# Patient Record
Sex: Female | Born: 1975 | Marital: Married
Health system: Southern US, Community
[De-identification: ages and names within clinical notes are randomized; demographics above are authoritative.]

---

## 2011-04-03 ENCOUNTER — Encounter: Payer: Self-pay | Admitting: Obstetrics & Gynecology

## 2011-08-20 ENCOUNTER — Emergency Department: Payer: Self-pay | Admitting: Emergency Medicine

## 2011-08-20 LAB — CBC
HCT: 41.4 % (ref 35.0–47.0)
HGB: 13.9 g/dL (ref 12.0–16.0)
MCH: 31.3 pg (ref 26.0–34.0)
MCHC: 33.6 g/dL (ref 32.0–36.0)
Platelet: 200 10*3/uL (ref 150–440)
RDW: 14.2 % (ref 11.5–14.5)

## 2011-08-20 LAB — HCG, QUANTITATIVE, PREGNANCY: Beta Hcg, Quant.: 3665 m[IU]/mL — ABNORMAL HIGH

## 2011-08-21 ENCOUNTER — Ambulatory Visit: Payer: Self-pay | Admitting: Obstetrics and Gynecology

## 2011-08-21 LAB — CBC
HCT: 39.6 % (ref 35.0–47.0)
MCH: 31.1 pg (ref 26.0–34.0)
MCHC: 33.7 g/dL (ref 32.0–36.0)
Platelet: 212 10*3/uL (ref 150–440)
RDW: 14 % (ref 11.5–14.5)
WBC: 7.3 10*3/uL (ref 3.6–11.0)

## 2011-08-21 LAB — URINALYSIS, COMPLETE
Glucose,UR: NEGATIVE mg/dL (ref 0–75)
Ketone: NEGATIVE
Nitrite: NEGATIVE
Ph: 5 (ref 4.5–8.0)
Protein: NEGATIVE
Specific Gravity: 1.023 (ref 1.003–1.030)
Squamous Epithelial: 2

## 2011-08-22 ENCOUNTER — Ambulatory Visit: Payer: Self-pay | Admitting: Obstetrics and Gynecology

## 2013-10-31 IMAGING — US US OB < 14 WEEKS
1 series · 17 of 28 positions shown · non-contrast
Comparison: none

REASON FOR EXAM: vaginal bleeding
COMMENTS:

[Series 1: us ob < 14 weeks · 38 acquisitions, 17 frames shown]
[im 1/38]
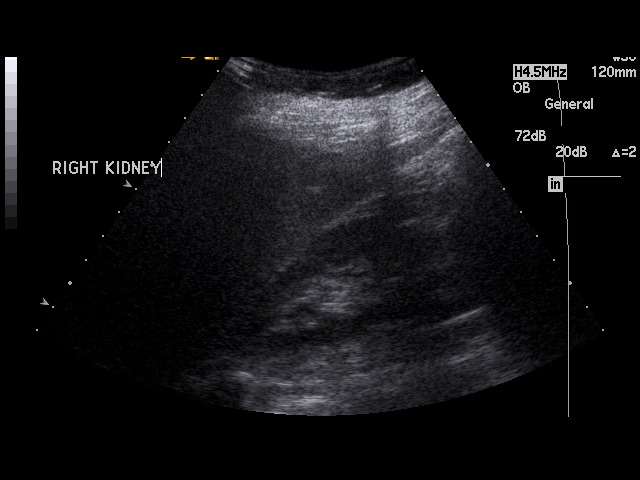
[im 3/38]
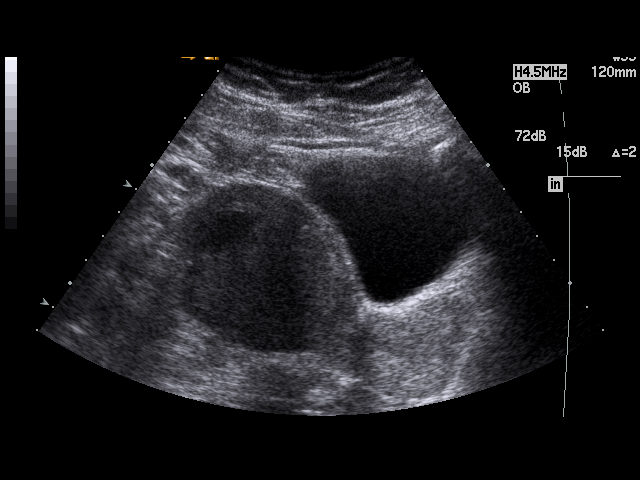
[im 6/38]
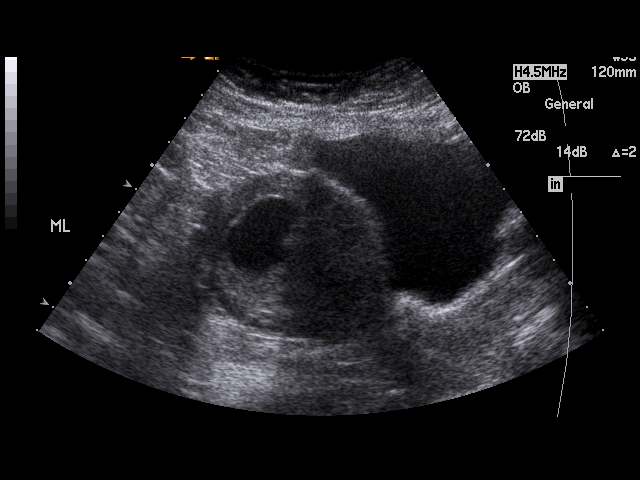
[im 7/38]
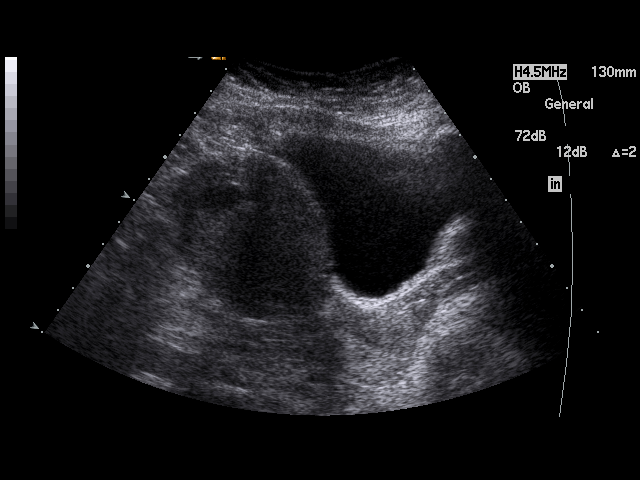
[im 10/38]
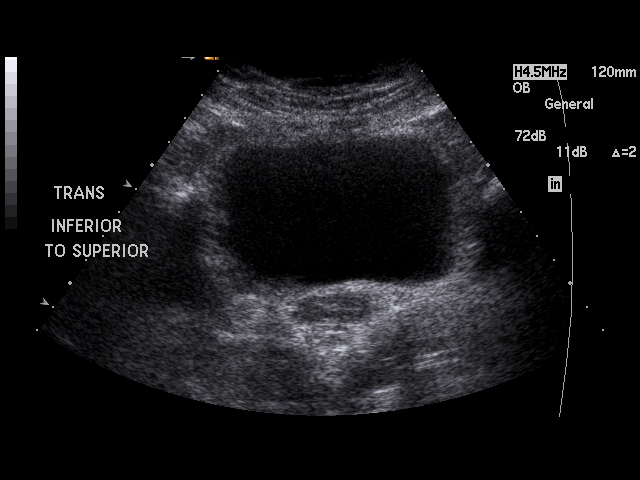
[im 13/38]
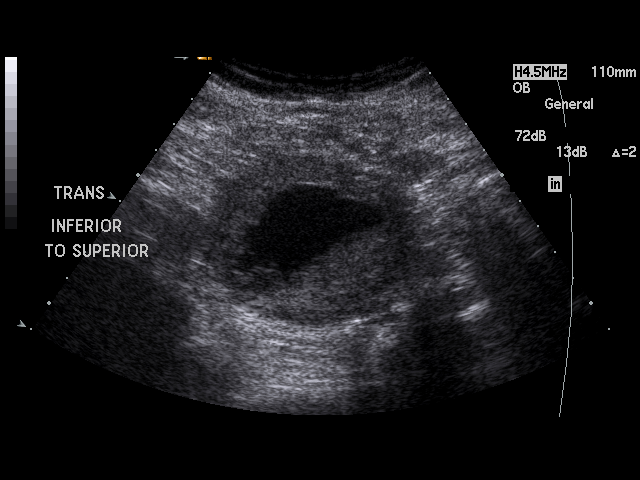
[im 14/38]
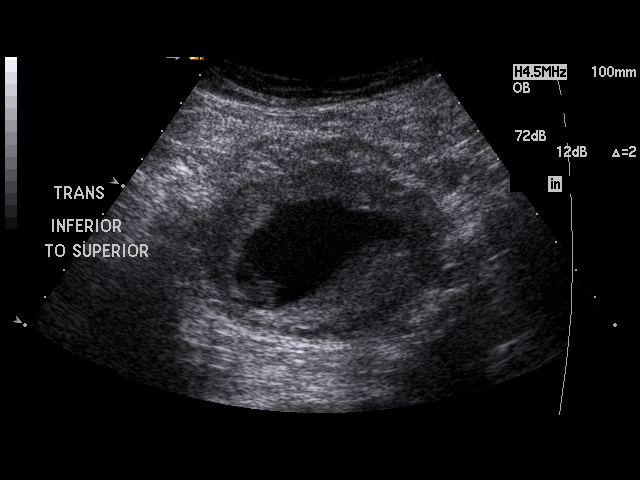
[im 17/38]
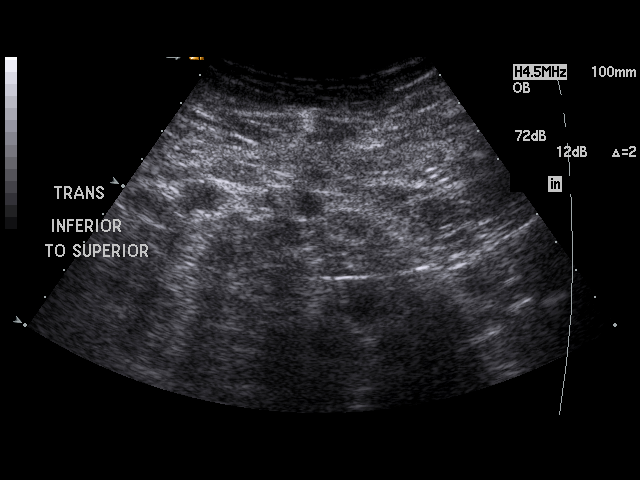
[im 20/38]
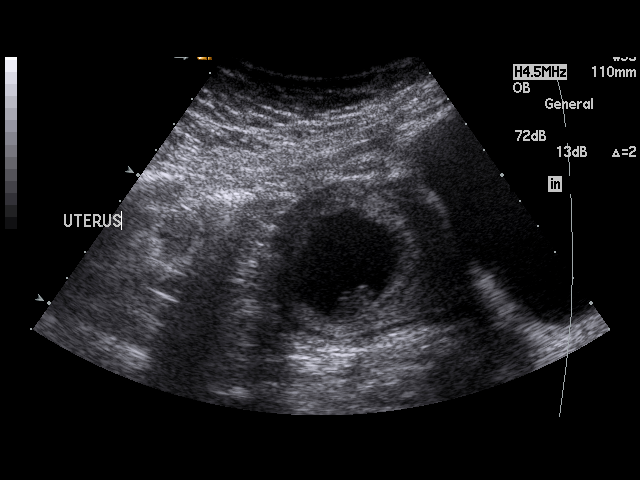
[im 21/38]
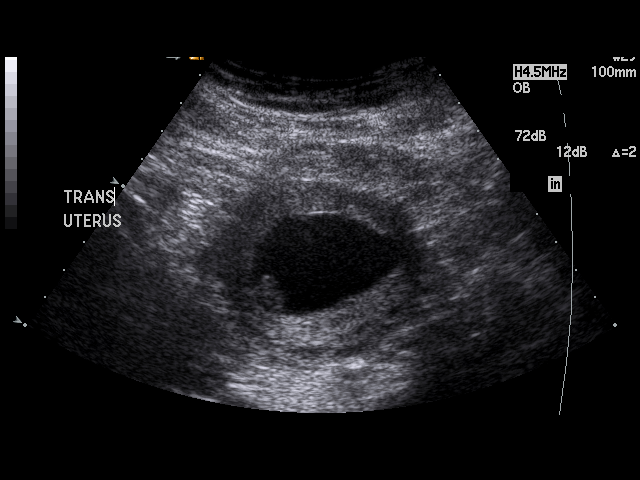
[im 24/38]
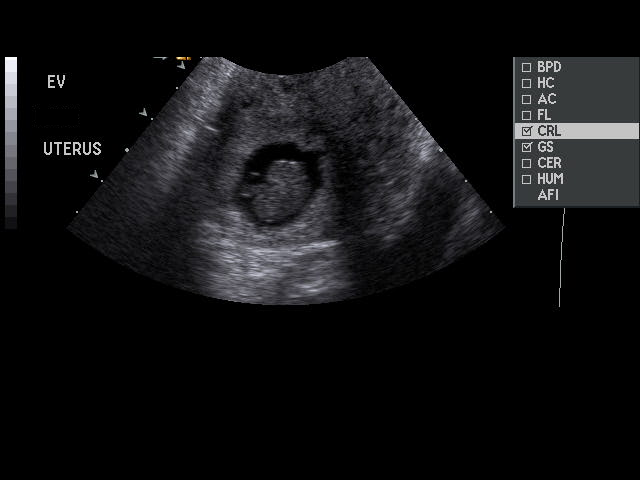
[im 25/38]
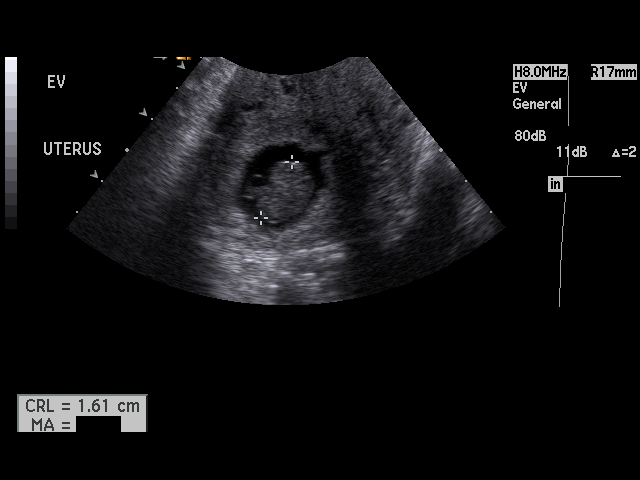
[im 28/38]
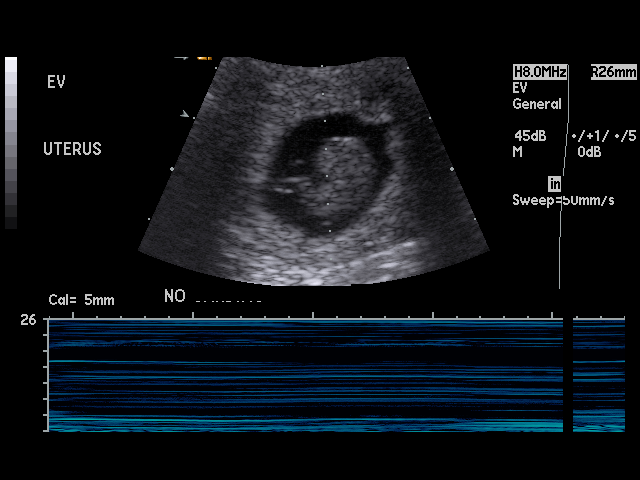
[im 31/38]
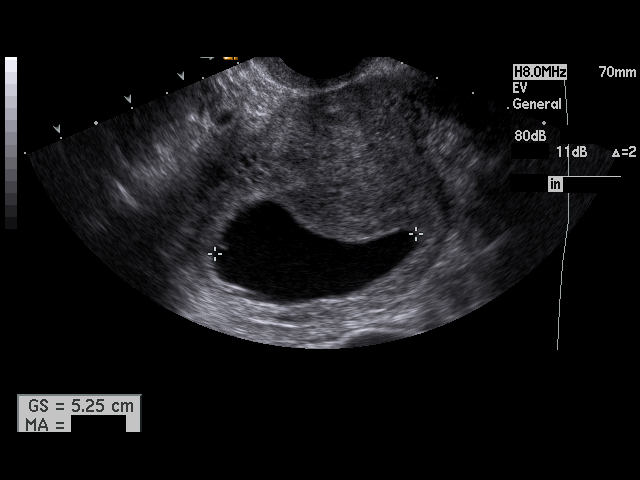
[im 32/38]
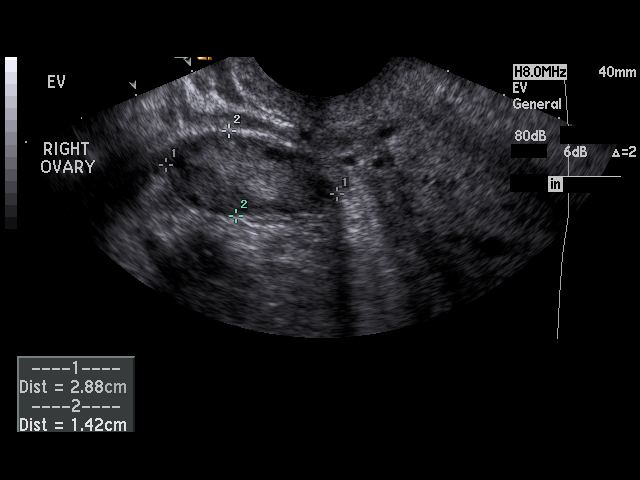
[im 35/38]
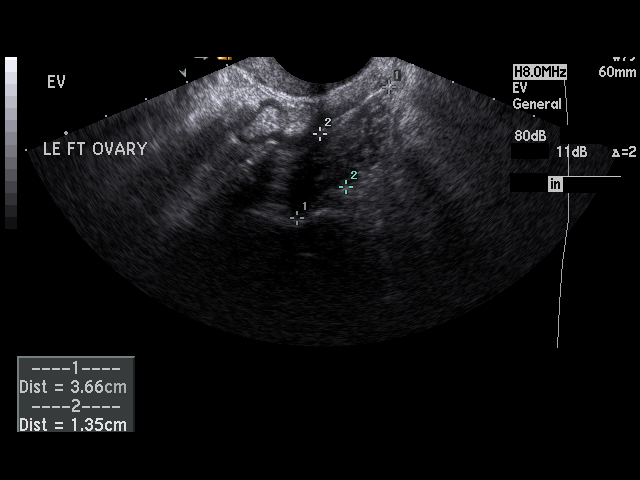
[im 38/38]
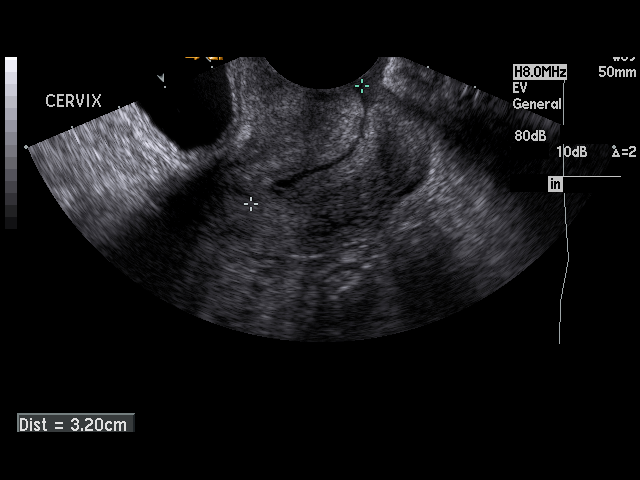

[17 of 28 positions shown; findings below may reference images not displayed]

PROCEDURE:     US  - US OB LESS THAN 14 WEEKS  - August 20, 2011 [DATE]

RESULT:     There is a gravid uterus present. No cardiac activity is
demonstrated. The crown-rump length of the fetus is 1.58 cm corresponding to
an 8 week 0 day gestation. The gestational sac size is 5.29 cm which
corresponds to an 11 week one day gestation.

The maternal ovaries are normal in size and echotexture.
IMPRESSION: The findings are worrisome for fetal demise and threatened
abortion. No cardiac activity is demonstrated. There is discrepancy between
the gestational sac size and the fetal pole by approximately 3 weeks. No
maternal adnexal abnormality is demonstrated.

## 2014-09-17 NOTE — Op Note (Signed)
PATIENT NAME:  Tracy Ayers, Tracy Ayers MR#:  [id number] DATE OF BIRTH:  Aug 18, 1975  DATE OF PROCEDURE:  08/22/2011  PREOPERATIVE DIAGNOSIS: Missed abortion.   POSTOPERATIVE DIAGNOSIS: Missed abortion.     PROCEDURE: Suction curette dilation and curettage.   SURGEON: Elliot Gurneyarrie C. Marx Doig, M.D.   ANESTHESIA: IV sedation with MAC.   ESTIMATED BLOOD LOSS: 50 mL.   FINDINGS: Approximately 12 week size sounding uterus with products of conception, also sent for genetics.   DESCRIPTION OF PROCEDURE: The patient was taken to the Operating Room, placed in the supine position. After adequate anesthesia was instilled, time out was performed and the patient was prepped and draped in the usual sterile fashion. A side-opening speculum was placed in the patient's vagina. The anterior lip of the cervix was grasped with a single-tooth tenaculum. Cervix was sounded and anteriorly dilated to accommodate the eight curved suction curette. Dilatation and curettage was performed. Suction was performed and another dilatation and curettage to be sure that the insides of the uterus were gritty and all the products of conception were obtained. One more suction curettage was performed. The single-tooth tenaculum was removed. 0.2 Methergine IM was administered to the patient. The patient was then laid supine after removing the single-sided speculum. Fundus was massaged and found to be firm. The patient was then taken to recovery after having tolerated the procedure well.   ____________________________ Elliot Gurneyarrie C. Zakary Kimura, MD cck:ap D: 08/25/2011 18:08:24 ET T: 08/26/2011 09:49:32 ET JOB#: [id number] cc: Elliot Gurneyarrie C. Garald Rhew, MD, <Dictator> Elliot GurneyARRIE C Mariany Mackintosh MD ELECTRONICALLY SIGNED 09/08/2011 10:13
